# Patient Record
Sex: Male | Born: 2000 | Race: White | Hispanic: No | Marital: Single | Smoking: Never smoker
Health system: Southern US, Community
[De-identification: ages and names within clinical notes are randomized; demographics above are authoritative.]

---
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
## 2018-07-26 ENCOUNTER — Ambulatory Visit: Admitting: Family Medicine

## 2019-04-06 ENCOUNTER — Other Ambulatory Visit: Payer: Self-pay

## 2019-04-06 DIAGNOSIS — Z20822 Contact with and (suspected) exposure to covid-19: Secondary | ICD-10-CM

## 2019-04-09 LAB — NOVEL CORONAVIRUS, NAA: SARS-CoV-2, NAA: NOT DETECTED

## 2019-04-11 ENCOUNTER — Telehealth: Payer: Self-pay

## 2019-04-11 NOTE — Telephone Encounter (Signed)
Pt called and informed patient that test for Covid 19 was NEGATIVE. Discussed signs and symptoms of Covid 19 : fever, chills, respiratory symptoms, cough, ENT symptoms, sore throat, SOB, muscle pain, diarrhea, headache, loss of taste/smell, close exposure to COVID-19 patient. Pt instructed to call PCP if they develop the above signs and sx. Pt also instructed to call 911 if having respiratory issues/distress. Discussed MyChart enrollment. Pt verbalized understanding.  

## 2020-03-23 ENCOUNTER — Ambulatory Visit
Admission: RE | Admit: 2020-03-23 | Discharge: 2020-03-23 | Disposition: A | Discharge status: Home / Self Care | Payer: No Typology Code available for payment source | Source: Ambulatory Visit | Attending: Nurse Practitioner | Admitting: Nurse Practitioner

## 2020-03-23 ENCOUNTER — Other Ambulatory Visit: Payer: Self-pay | Admitting: Nurse Practitioner

## 2020-03-23 DIAGNOSIS — Z021 Encounter for pre-employment examination: Secondary | ICD-10-CM

## 2021-06-30 DIAGNOSIS — Z8616 Personal history of COVID-19: Secondary | ICD-10-CM | POA: Diagnosis not present

## 2021-06-30 DIAGNOSIS — Z20822 Contact with and (suspected) exposure to covid-19: Secondary | ICD-10-CM | POA: Diagnosis not present

## 2021-11-19 IMAGING — CR DG CHEST 1V
1 series · 1 of 1 positions shown · non-contrast
Comparison: None.

CLINICAL DATA: Pre-employment examination

EXAM:
CHEST  1 VIEW

[w chest pa]
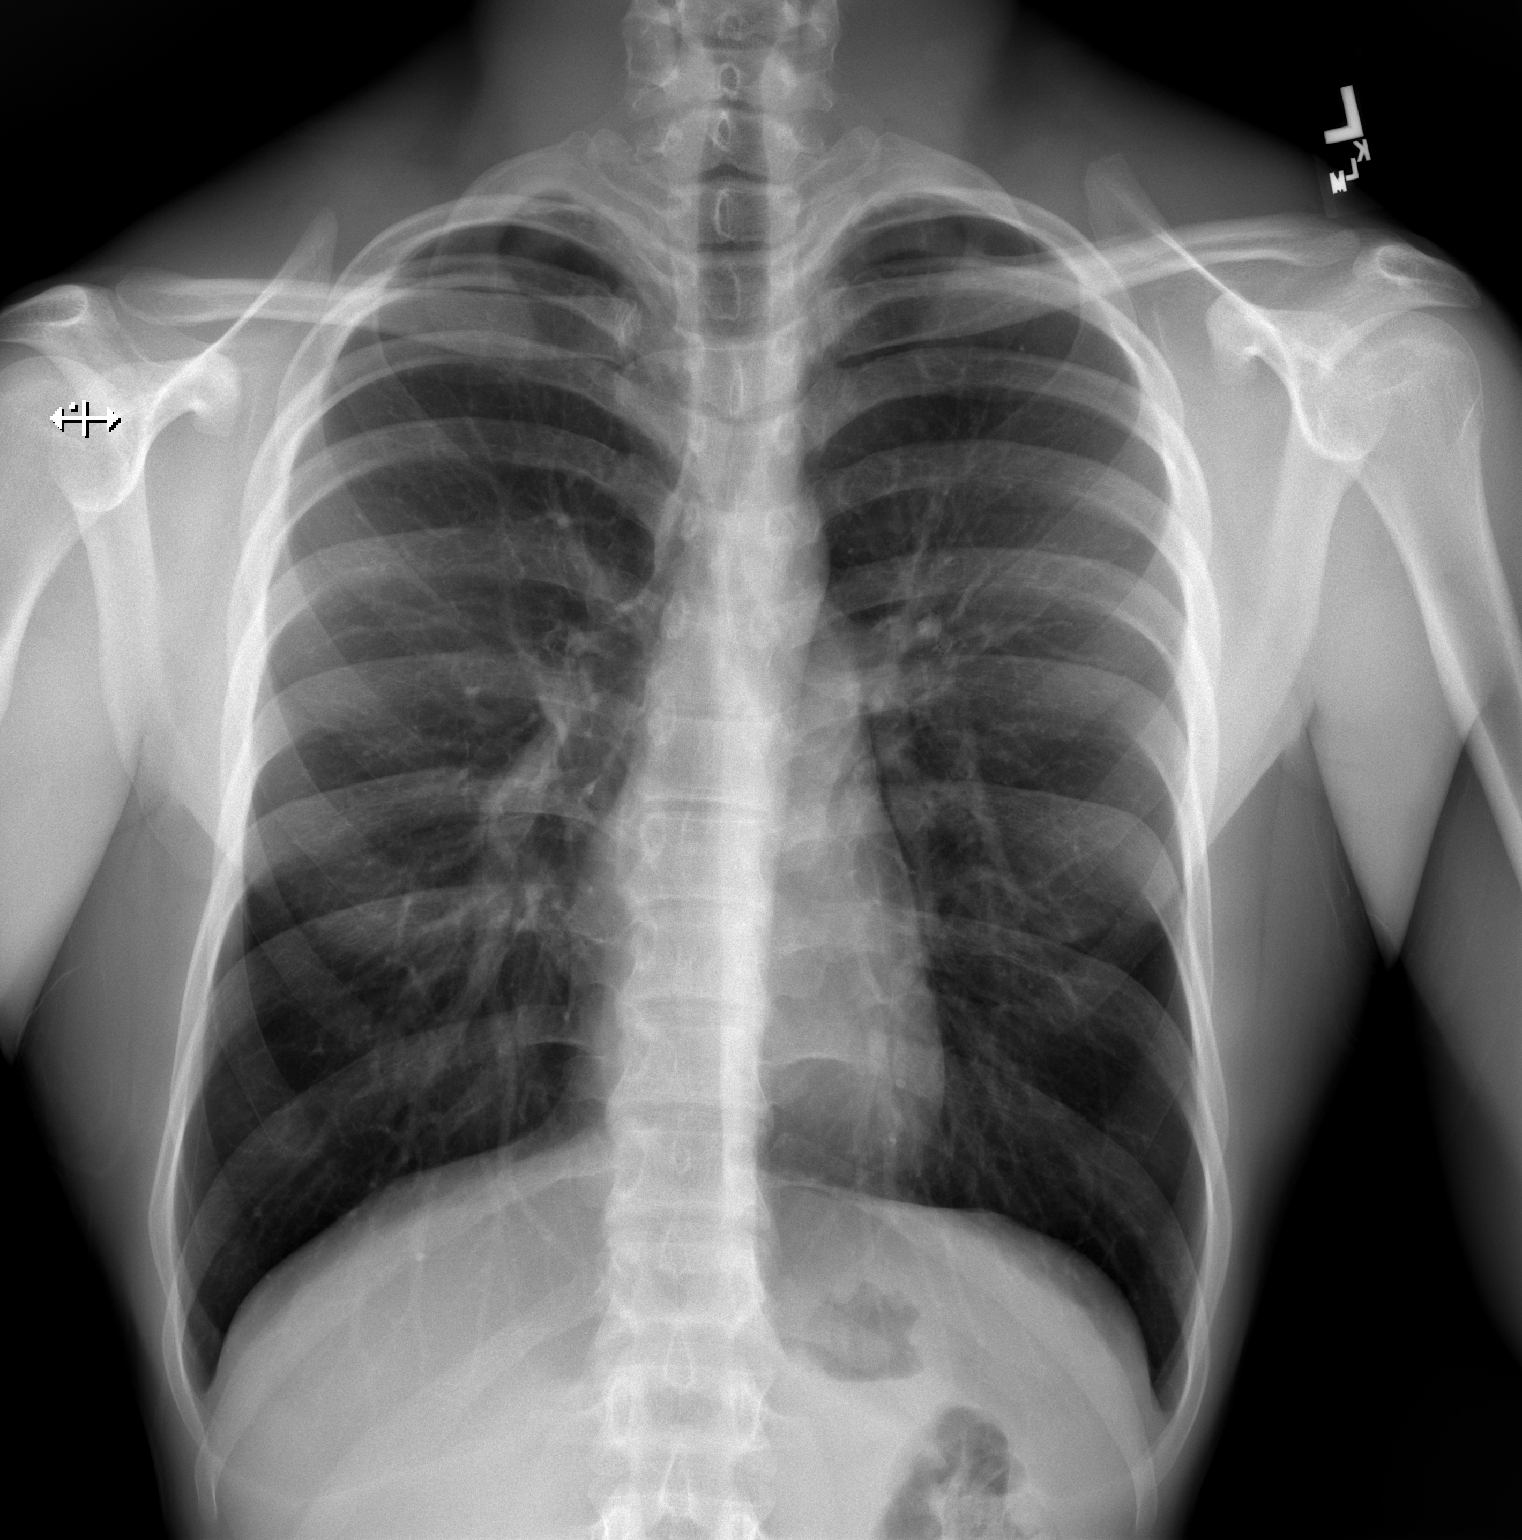

[1 of 1 positions shown; findings below may reference images not displayed]

FINDINGS: The heart size and mediastinal contours are within normal limits.
Both lungs are clear. The visualized skeletal structures are
unremarkable.
IMPRESSION: No active disease.

## 2024-09-16 ENCOUNTER — Ambulatory Visit
Admission: EM | Admit: 2024-09-16 | Discharge: 2024-09-16 | Disposition: A | Discharge status: Home / Self Care | Attending: Student | Admitting: Student

## 2024-09-16 ENCOUNTER — Encounter: Payer: Self-pay | Admitting: Emergency Medicine

## 2024-09-16 ENCOUNTER — Other Ambulatory Visit: Payer: Self-pay

## 2024-09-16 DIAGNOSIS — Z202 Contact with and (suspected) exposure to infections with a predominantly sexual mode of transmission: Secondary | ICD-10-CM | POA: Insufficient documentation

## 2024-09-16 MED ORDER — DOXYCYCLINE HYCLATE 100 MG PO CAPS: 100.0000 mg | ORAL_CAPSULE | Freq: Two times a day (BID) | ORAL | 0 refills | Status: AC

## 2024-09-16 NOTE — ED Provider Notes (Signed)
 " [facility]    CSN: [id number] Arrival date & time: 09/16/24  9076      History   Chief Complaint Chief Complaint  Patient presents with   Exposure to STD    HPI Timothy Ortega is a 24 y.o. male presenting following STI exposure.  Pt presents to UC and reports was exposed to chlamydia, mycoplasma, and ureaplasma. He is asymptomatic.    Last unprotected intercourse November 2025. Pt denies pain or symptoms at this time.   HPI  History reviewed. No pertinent past medical history.  There are no active problems to display for this patient.   History reviewed. No pertinent surgical history.     Home Medications    Prior to Admission medications  Medication Sig Start Date End Date Taking? Authorizing Provider  doxycycline (VIBRAMYCIN) 100 MG capsule Take 1 capsule (100 mg total) by mouth 2 (two) times daily for 7 days. 09/16/24 09/23/24 Yes Arlyss Leita BRAVO, PA-C    Family History Family History  Problem Relation Age of Onset   Hearing loss Mother     Social History Social History[1]   Allergies   Patient has no known allergies.   Review of Systems Review of Systems  Constitutional:  Negative for chills and fever.  HENT:  Negative for sore throat.   Eyes:  Negative for pain and redness.  Respiratory:  Negative for shortness of breath.   Cardiovascular:  Negative for chest pain.  Gastrointestinal:  Negative for abdominal pain, diarrhea, nausea and vomiting.  Genitourinary:  Negative for decreased urine volume, difficulty urinating, dysuria, flank pain, frequency, genital sores, hematuria and urgency.  Musculoskeletal:  Negative for back pain.  Skin:  Negative for rash.     Physical Exam Triage Vital Signs ED Triage Vitals [09/16/24 1009]  Encounter Vitals Group     BP 118/78     Girls Systolic BP Percentile      Girls Diastolic BP Percentile      Boys Systolic BP Percentile      Boys Diastolic BP Percentile      Pulse Rate (!) 105     Resp  20     Temp 98.2 F (36.8 C)     Temp Source Oral     SpO2 96 %     Weight      Height      Head Circumference      Peak Flow      Pain Score 0     Pain Loc      Pain Education      Exclude from Growth Chart    No data found.  Updated Vital Signs BP 118/78 (BP Location: Right Arm)   Pulse (!) 105   Temp 98.2 F (36.8 C) (Oral)   Resp 20   SpO2 96%   Visual Acuity Right Eye Distance:   Left Eye Distance:   Bilateral Distance:    Right Eye Near:   Left Eye Near:    Bilateral Near:     Physical Exam Vitals reviewed.  Constitutional:      General: He is not in acute distress.    Appearance: Normal appearance. He is not ill-appearing.  HENT:     Head: Normocephalic and atraumatic.     Mouth/Throat:     Mouth: Mucous membranes are moist.     Comments: Moist mucous membranes Eyes:     Extraocular Movements: Extraocular movements intact.     Pupils: Pupils are equal, round, and  reactive to light.  Cardiovascular:     Rate and Rhythm: Normal rate and regular rhythm.     Heart sounds: Normal heart sounds.  Pulmonary:     Effort: Pulmonary effort is normal.     Breath sounds: Normal breath sounds. No wheezing, rhonchi or rales.  Abdominal:     General: Bowel sounds are normal. There is no distension.     Palpations: Abdomen is soft. There is no mass.     Tenderness: There is no abdominal tenderness. There is no right CVA tenderness, left CVA tenderness, guarding or rebound.  Skin:    General: Skin is warm.     Capillary Refill: Capillary refill takes less than 2 seconds.     Comments: Good skin turgor  Neurological:     General: No focal deficit present.     Mental Status: He is alert and oriented to person, place, and time.  Psychiatric:        Mood and Affect: Mood normal.        Behavior: Behavior normal.      UC Treatments / Results  Labs (all labs ordered are listed, but only abnormal results are displayed) Labs Reviewed  MISC [facility] TEST (SEND  OUT)  HIV ANTIBODY (ROUTINE TESTING W REFLEX)  SYPHILIS: RPR W/REFLEX TO RPR TITER AND TREPONEMAL ANTIBODIES, TRADITIONAL SCREENING AND DIAGNOSIS ALGORITHM  CYTOLOGY, (ORAL, ANAL, URETHRAL) ANCILLARY ONLY    EKG   Radiology No results found.  Procedures Procedures (including critical care time)  Medications Ordered in UC Medications - No data to display  Initial Impression / Assessment and Plan / UC Course  I have reviewed the triage vital signs and the nursing notes.  Pertinent labs & imaging results that were available during my care of the patient were reviewed by me and considered in my medical decision making (see chart for details).     Patient is a pleasant 24 y.o. male presenting with STI exposure-chlamydia, mycoplasma, ureaplasma. The patient is afebrile and nontachycardic.  Antipyretic has not been administered today.  Will send self-swab for G/C, trich, mycoplasma. Also sent HIV, RPR. Doxycycline sent today. Safe sex precautions.    Final Clinical Impressions(s) / UC Diagnoses   Final diagnoses:  Exposure to sexually transmitted disease (STD)     Discharge Instructions      -Doxycycline twice daily for 7 days, to treat for chlamydia, mycoplasma, ureaplasma.  Make sure to wear sunscreen while spending time outside while on this medication as it can increase your chance of sunburn. You can take this medication with food if you have a sensitive stomach. - We are testing for gonorrhea, chlamydia, trichomonas, mycoplasma, HIV, syphilis  -We will call with any positive results in about 3 business days and can send treatment if necessary. -We will not call with negative lab results. -Lab results will automatically go to your MyChart.      ED Prescriptions     Medication Sig Dispense Auth. Provider   doxycycline (VIBRAMYCIN) 100 MG capsule Take 1 capsule (100 mg total) by mouth 2 (two) times daily for 7 days. 14 capsule Masayoshi Couzens E, PA-C      PDMP not  reviewed this encounter.     [1]  Social History Tobacco Use   Smoking status: Never   Smokeless tobacco: Former   Tobacco comments:    dip- quit 2014  Substance Use Topics   Alcohol use: No    Alcohol/week: 0.0 standard drinks of alcohol   Drug use:  No     Arlyss Leita BRAVO, PA-C 09/16/24 1057  "

## 2024-09-16 NOTE — ED Triage Notes (Signed)
 Pt presents to UC and reports was exposed to chlamydia, microplasma, and renal microplasma.   Last unprotected intercourse November 2025. Pt denies pain or symptoms at this time.

## 2024-09-16 NOTE — Discharge Instructions (Addendum)
-  Doxycycline twice daily for 7 days, to treat for chlamydia, mycoplasma, ureaplasma.  Make sure to wear sunscreen while spending time outside while on this medication as it can increase your chance of sunburn. You can take this medication with food if you have a sensitive stomach. - We are testing for gonorrhea, chlamydia, trichomonas, mycoplasma, HIV, syphilis  -We will call with any positive results in about 3 business days and can send treatment if necessary. -We will not call with negative lab results. -Lab results will automatically go to your MyChart.

## 2024-09-17 LAB — SYPHILIS: RPR W/REFLEX TO RPR TITER AND TREPONEMAL ANTIBODIES, TRADITIONAL SCREENING AND DIAGNOSIS ALGORITHM: RPR Ser Ql: NONREACTIVE

## 2024-09-17 LAB — HIV ANTIBODY (ROUTINE TESTING W REFLEX): HIV Screen 4th Generation wRfx: NONREACTIVE

## 2024-09-19 LAB — CYTOLOGY, (ORAL, ANAL, URETHRAL) ANCILLARY ONLY
Chlamydia: NEGATIVE
Comment: NEGATIVE
Comment: NEGATIVE
Comment: NORMAL
Neisseria Gonorrhea: NEGATIVE
Trichomonas: NEGATIVE

## 2024-09-22 LAB — MISC LABCORP TEST (SEND OUT): Labcorp test code: 180076
